# Patient Record
Sex: Male | Born: 1980 | Race: White | Hispanic: No | Marital: Married | Smoking: Never smoker
Health system: Southern US, Community
[De-identification: ages and names within clinical notes are randomized; demographics above are authoritative.]

---

## 2017-12-30 ENCOUNTER — Other Ambulatory Visit: Payer: Self-pay

## 2017-12-30 ENCOUNTER — Ambulatory Visit (INDEPENDENT_AMBULATORY_CARE_PROVIDER_SITE_OTHER): Payer: Worker's Compensation

## 2017-12-30 ENCOUNTER — Encounter: Payer: Self-pay | Admitting: Family Medicine

## 2017-12-30 ENCOUNTER — Ambulatory Visit (INDEPENDENT_AMBULATORY_CARE_PROVIDER_SITE_OTHER): Payer: Worker's Compensation | Admitting: Family Medicine

## 2017-12-30 VITALS — BP 132/90 | HR 83 | Temp 97.0°F | Ht 65.35 in | Wt 226.4 lb

## 2017-12-30 DIAGNOSIS — M79641 Pain in right hand: Secondary | ICD-10-CM

## 2017-12-30 DIAGNOSIS — M25531 Pain in right wrist: Secondary | ICD-10-CM | POA: Diagnosis not present

## 2017-12-30 DIAGNOSIS — M79601 Pain in right arm: Secondary | ICD-10-CM

## 2017-12-30 MED ORDER — IBUPROFEN 800 MG PO TABS: 800.0000 mg | ORAL_TABLET | Freq: Three times a day (TID) | ORAL | 0 refills | Status: DC | PRN

## 2017-12-30 MED ORDER — KETOROLAC TROMETHAMINE 60 MG/2ML IM SOLN
60.0000 mg | Freq: Once | INTRAMUSCULAR | Status: AC
Start: 2017-12-30 — End: 2017-12-30
  Administered 2017-12-30: 60 mg via INTRAMUSCULAR

## 2017-12-30 NOTE — Patient Instructions (Addendum)
Apply a compressive ACE bandage. Rest and elevate the affected painful area.  Apply cold compresses intermittently as needed.  As pain recedes, begin normal activities slowly as tolerated.    Ibuprofen  as needed for pain and swelling. Take with food.   Placed on light duty for work  Follow up in 1 week with another provider as I wont be in clinic.    IF you received an x-ray today, you will receive an invoice from [facility]. Please contact [facility] at [phone] with questions or concerns regarding your invoice.   IF you received labwork today, you will receive an invoice from [facility]. Please contact [facility] at [phone] with questions or concerns regarding your invoice.   Our billing staff will not be able to assist you with questions regarding bills from these companies.  You will be contacted with the lab results as soon as they are available. The fastest way to get your results is to activate your My Chart account. Instructions are located on the last page of this paperwork. If you have not heard from [place] regarding the results in 2 weeks, please contact this office.

## 2017-12-30 NOTE — Progress Notes (Signed)
5/28/201911:59 AM  Mark Liu 31-Oct-1980, 37 y.o. male [id number]  Chief Complaint  Patient presents with  . Hand Injury    RIGHT HAND INJURY INVOLVING GLASS TODAY    HPI:   Patient is a 37 y.o. male  who presents today for right forearm, wrist and hand pain and swelling  Today while at work, they were moving glass bathroom sliding doors They stated to fall over and they all landed onto his right arm They are about 100lbs each, glass did not brake He denies any numbness or tingling He is left handed  Fall Risk  12/30/2017  Falls in the past year? No     No flowsheet data found.  No Known Allergies  Prior to Admission medications   Medication Sig Start Date End Date Taking? Authorizing Provider  ARIPiprazole (ABILIFY) 10 MG tablet Take 10 mg by mouth daily. 11/28/17  Yes [provider]  buPROPion (WELLBUTRIN XL) 300 MG 24 hr tablet Take 300 mg by mouth daily. 11/28/17  Yes [provider]  clonazePAM (KLONOPIN) 1 MG tablet TAKE ONE-HALF TO ONE TABLET EVERY 8 HOURS AS NEEDED FOR ANXIETY 11/28/17  Yes [provider]  FLUoxetine (PROZAC) 20 MG capsule Take 20 mg by mouth daily. 12/12/17  Yes [provider]  lamoTRIgine (LAMICTAL) 150 MG tablet Take 150 mg by mouth daily. 11/28/17  Yes [provider]  modafinil (PROVIGIL) 200 MG tablet START IN ONE MONTH TAKE 1 TABLET BY MOUTH EVERY MORNING 11/28/17  Yes [provider]    History reviewed. No pertinent past medical history.  History reviewed. No pertinent surgical history.  Social History   Tobacco Use  . Smoking status: Never Smoker  . Smokeless tobacco: Never Used  Substance Use Topics  . Alcohol use: Not Currently    Family History  Problem Relation Age of Onset  . Healthy Mother   . Healthy Father     ROS Per hpi  OBJECTIVE:  Blood pressure 132/90, pulse 83, temperature (!) 97 F (36.1 C), temperature source Oral, height 5' 5.35" (1.66 m), weight  226 lb 6.4 oz (102.7 kg), SpO2 97 %.  Physical Exam  Gen: aaox3, nad RUE: swelling and TTP from 1/3 distal forearm to fingers. No ecchymosis, laceration or crepitus. There are some superficial abrasions over flexor side of forearm. No bleeding. Passive FROM. Distally NVI.  No results found for this or any previous visit (from the past 24 hour(s)).  Dg Forearm Right  Result Date: 12/30/2017 CLINICAL DATA:  Crush injury with possible retained foreign body. EXAM: RIGHT FOREARM - 2 VIEW COMPARISON:  None. FINDINGS: There is no evidence of fracture or other focal bone lesions. Soft tissues are unremarkable. IMPRESSION: No acute abnormality noted. Electronically Signed   By: Alcide Clever M.D.   On: 12/30/2017 12:20   Dg Hand Complete Right  Result Date: 12/30/2017 CLINICAL DATA:  Crush injury EXAM: RIGHT HAND - COMPLETE 3+ VIEW COMPARISON:  None. FINDINGS: There is no evidence of fracture or dislocation. There is no evidence of arthropathy or other focal bone abnormality. Soft tissues are unremarkable. IMPRESSION: No acute abnormality noted. Electronically Signed   By: Alcide Clever M.D.   On: 12/30/2017 12:23     ASSESSMENT and PLAN  1. Right arm pain - DG Forearm Right; Future - DG Hand Complete Right; Future - Apply ace wrap - ketorolac (TORADOL) injection 60 mg  2. Right wrist pain - DG Forearm Right; Future - DG Hand Complete Right; Future -  Apply ace wrap  3. Right hand pain - DG Forearm Right; Future - DG Hand Complete Right; Future - Apply ace wrap  Other orders No fractures on xray, discussed RICE therapy. Placed on light duty, Re-eval in 1 week.  - ibuprofen (ADVIL,MOTRIN) 800 MG tablet; Take 1 tablet (800 mg total) by mouth every 8 (eight) hours as needed.  Return in about 1 week (around 01/06/2018).    Myles Lipps, MD Primary Care at [facility] [address] [place], [place] [address] Ph.  [phone] Fax [phone]

## 2018-01-01 ENCOUNTER — Telehealth: Payer: Self-pay

## 2018-01-01 MED ORDER — HYDROCODONE-ACETAMINOPHEN 7.5-325 MG PO TABS: 1.0000 | ORAL_TABLET | Freq: Four times a day (QID) | ORAL | 0 refills | Status: DC | PRN

## 2018-01-01 NOTE — Telephone Encounter (Signed)
Spoke with pt.  Advised Dr. Adela Glimpse message.

## 2018-01-01 NOTE — Telephone Encounter (Signed)
Copied from CRM [id number]. Topic: General - Other >> Dec 31, 2017 12:46 PM Mcneil, Ja-Kwan wrote: Reason for CRM: Pt states the ibuprofen (ADVIL,MOTRIN) 800 MG tablet is not strong enough to relieve the pain. Pt requesting a call back to discuss option of getting a stronger medication. Cb# [phone]  Message to Dr. Leretha Pol for review

## 2018-01-01 NOTE — Telephone Encounter (Signed)
Please let patient know that I just sent a small prescription in for vicodin, which is an opiate. Please discuss with him that he is not mix with alcohol or other medications that make him sleepy. Also he is not to drive until he understands how it affects him. Thanks

## 2018-01-03 ENCOUNTER — Ambulatory Visit: Payer: Self-pay | Admitting: Family Medicine

## 2018-01-13 ENCOUNTER — Encounter: Payer: Self-pay | Admitting: Family Medicine

## 2018-01-13 ENCOUNTER — Other Ambulatory Visit: Payer: Self-pay

## 2018-01-13 ENCOUNTER — Ambulatory Visit (INDEPENDENT_AMBULATORY_CARE_PROVIDER_SITE_OTHER): Payer: Worker's Compensation | Admitting: Family Medicine

## 2018-01-13 VITALS — BP 112/70 | HR 98 | Temp 97.6°F | Resp 16 | Ht 74.0 in | Wt 231.0 lb

## 2018-01-13 DIAGNOSIS — M25531 Pain in right wrist: Secondary | ICD-10-CM

## 2018-01-13 DIAGNOSIS — M79644 Pain in right finger(s): Secondary | ICD-10-CM

## 2018-01-13 MED ORDER — IBUPROFEN 800 MG PO TABS: 800.0000 mg | ORAL_TABLET | Freq: Three times a day (TID) | ORAL | 0 refills | Status: AC | PRN

## 2018-01-13 MED ORDER — HYDROCODONE-ACETAMINOPHEN 5-325 MG PO TABS
1.0000 | ORAL_TABLET | Freq: Four times a day (QID) | ORAL | 0 refills | Status: AC | PRN
Start: 2018-01-13 — End: ?

## 2018-01-13 NOTE — Patient Instructions (Signed)
     IF you received an x-ray today, you will receive an invoice from [facility]. Please contact [facility] at [phone] with questions or concerns regarding your invoice.   IF you received labwork today, you will receive an invoice from [facility]. Please contact [facility] at [phone] with questions or concerns regarding your invoice.   Our billing staff will not be able to assist you with questions regarding bills from these companies.  You will be contacted with the lab results as soon as they are available. The fastest way to get your results is to activate your My Chart account. Instructions are located on the last page of this paperwork. If you have not heard from us regarding the results in 2 weeks, please contact this office.     

## 2018-01-13 NOTE — Progress Notes (Signed)
6/11/20191:38 PM  Mark Liu 1981-02-14, 37 y.o. male [id number]  Chief Complaint  Patient presents with  . right hand injury    1 week f/u.  Per pt hand is the same but hurts in different places now.  Taking norco for the pain    HPI:   Patient is a 37 y.o. male who presents today for followup on right arm injury that happened on 12/30/17  Injury fall of over 800 lbs of glass (bathroom sliding doors) onto hyperextended wrist He has been wearing compression ace warp, icing and elevating He states swelling and bruising improved Still having pain at base of thumb and along hyperthenar prominence, pain radiates up radial side of forearm States unable to pinch such as turning keys in car ignition, open bags, due to pain Denies any numbness or tingling He is left handed Has been on light duty at work Medications helping Normal xrays  Fall Risk  01/13/2018 12/30/2017  Falls in the past year? No No     Depression screen PHQ 2/9 01/13/2018  Decreased Interest 0  Down, Depressed, Hopeless 0  PHQ - 2 Score 0    Allergies  Allergen Reactions  . Lidocaine Nausea And Vomiting  . Sulfa Antibiotics Other (See Comments)    Prior to Admission medications   Medication Sig Start Date End Date Taking? Authorizing Provider  ARIPiprazole (ABILIFY) 10 MG tablet Take 10 mg by mouth daily. 11/28/17  Yes [provider]  buPROPion (WELLBUTRIN XL) 300 MG 24 hr tablet Take 300 mg by mouth daily. 11/28/17  Yes [provider]  clonazePAM (KLONOPIN) 1 MG tablet TAKE ONE-HALF TO ONE TABLET EVERY 8 HOURS AS NEEDED FOR ANXIETY 11/28/17  Yes [provider]  FLUoxetine (PROZAC) 20 MG capsule Take 20 mg by mouth daily. 12/12/17  Yes [provider]  HYDROcodone-acetaminophen (NORCO) 7.5-325 MG tablet Take 1 tablet by mouth every 6 (six) hours as needed for moderate pain. 01/01/18  Yes Myles Lipps, MD  ibuprofen (ADVIL,MOTRIN) 800 MG tablet Take 1 tablet (800 mg  total) by mouth every 8 (eight) hours as needed. 12/30/17  Yes Myles Lipps, MD  lamoTRIgine (LAMICTAL) 150 MG tablet Take 150 mg by mouth daily. 11/28/17  Yes [provider]  modafinil (PROVIGIL) 200 MG tablet START IN ONE MONTH TAKE 1 TABLET BY MOUTH EVERY MORNING 11/28/17  Yes [provider]    History reviewed. No pertinent past medical history.  History reviewed. No pertinent surgical history.  Social History   Tobacco Use  . Smoking status: Never Smoker  . Smokeless tobacco: Never Used  Substance Use Topics  . Alcohol use: Not Currently    Family History  Problem Relation Age of Onset  . Healthy Mother   . Healthy Father     ROS Per hpi  OBJECTIVE:  Blood pressure 112/70, pulse 98, temperature 97.6 F (36.4 C), temperature source Oral, resp. rate 16, height 6\' 2"  (1.88 m), weight 231 lb (104.8 kg), SpO2 96 %.  Physical Exam  Gen: AAOx3, NAD MSK: right hand no swelling or ecchymosis noted, FROM of hand and wrist, pain with supination and extension of wrist. Pain with abduction of thumb. TTP at base of right thumb and hyperthenar eminence. + Finkelstein sign. NVI intact.   ASSESSMENT and PLAN  1. Pain of right thumb 2. Right wrist pain - Ambulatory referral to Hand Surgery  Cont with conservative measures. Cont light duty at work. Referring to hand for eval of sprain  vs more concerning ligamentous injury. Meds refilled.  Other orders - ibuprofen (ADVIL,MOTRIN) 800 MG tablet; Take 1 tablet (800 mg total) by mouth every 8 (eight) hours as needed. - HYDROcodone-acetaminophen (NORCO/VICODIN) 5-325 MG tablet; Take 1 tablet by mouth every 6 (six) hours as needed for moderate pain.  No follow-ups on file.    Myles LippsIrma M Santiago, MD [facility] at [facility] [address] [place], [place] [address] Ph.  [phone] Fax [phone]

## 2018-02-23 ENCOUNTER — Telehealth: Payer: Self-pay | Admitting: Family Medicine

## 2018-02-23 NOTE — Telephone Encounter (Signed)
Copied from CRM [phone]. Topic: Quick Communication - See Telephone Encounter >> Feb 20, 2018  9:34 AM Raquel SarnaHayes, Teresa G wrote: Asher MuirJamie [phone] [employer] - Handling Pt's Worker's Comp.  Claim # [id number]  Requested by fax and mail for pt's medical records and any bills - June 19th

## 2018-02-25 NOTE — Telephone Encounter (Signed)
Records fax on 02/25/18

## 2020-04-01 IMAGING — DX DG HAND COMPLETE 3+V*R*
3 series · 3 of 3 positions shown · non-contrast
Comparison: None.

CLINICAL DATA: Crush injury

EXAM:
RIGHT HAND - COMPLETE 3+ VIEW

[hand pa]
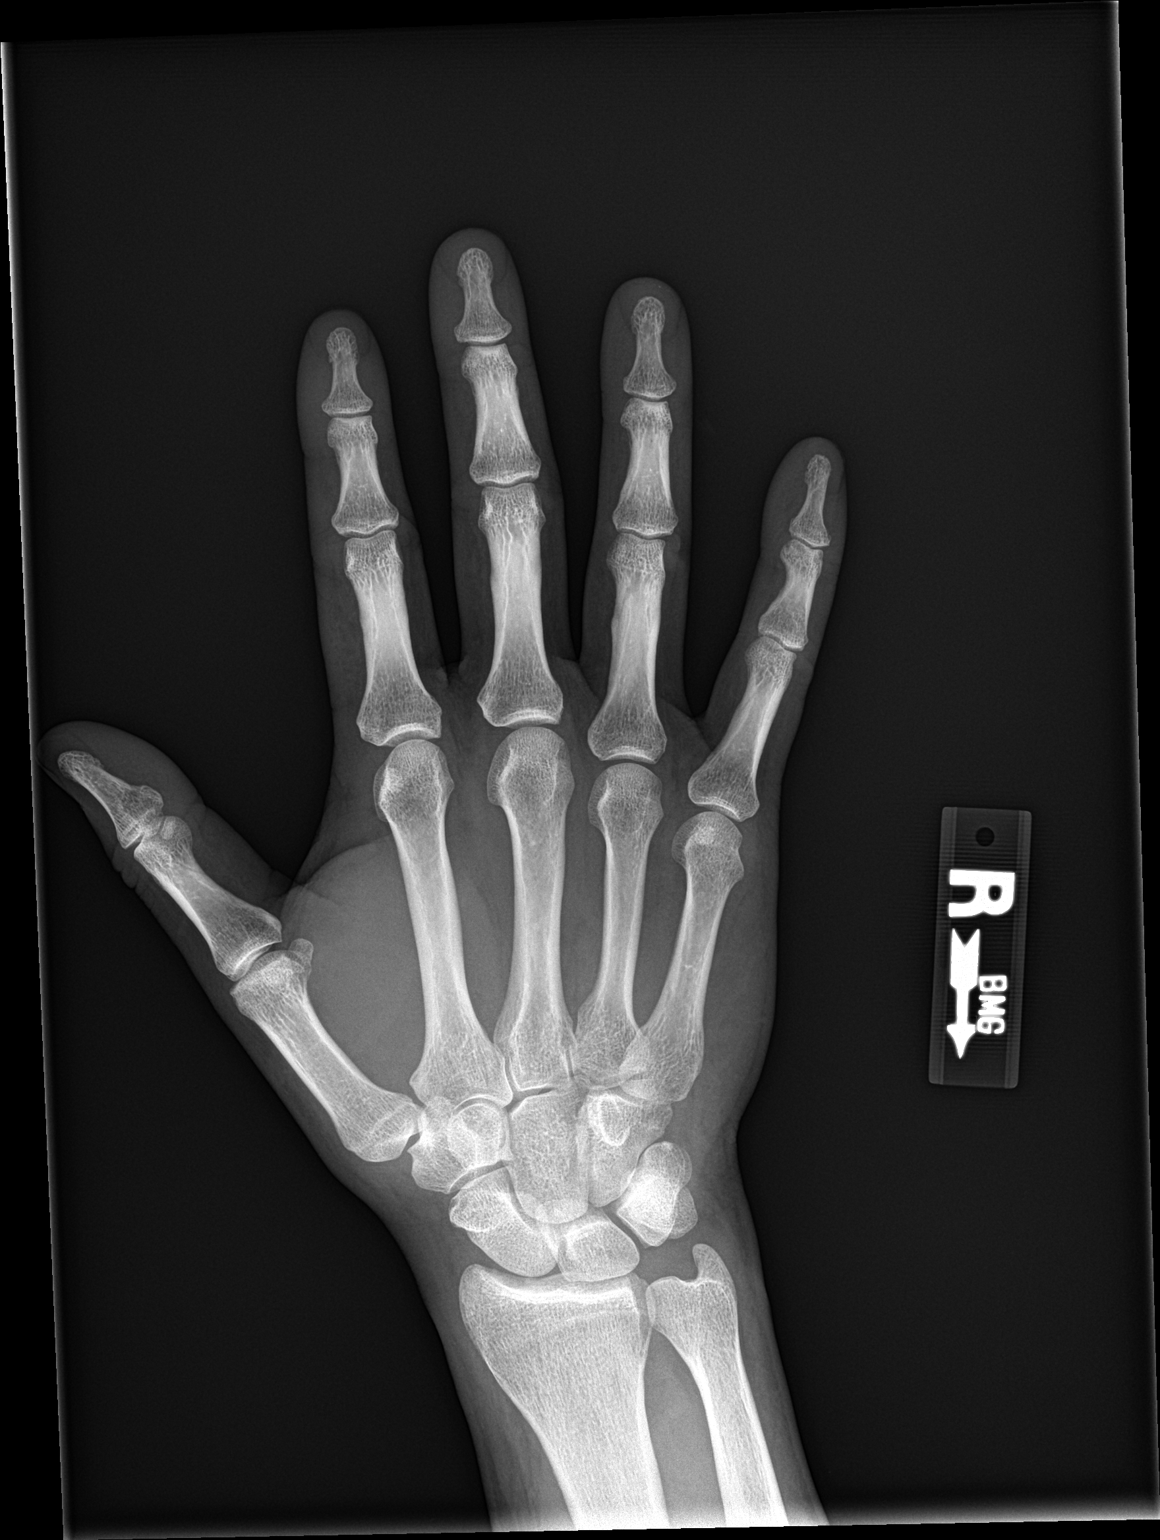

[hand obl]
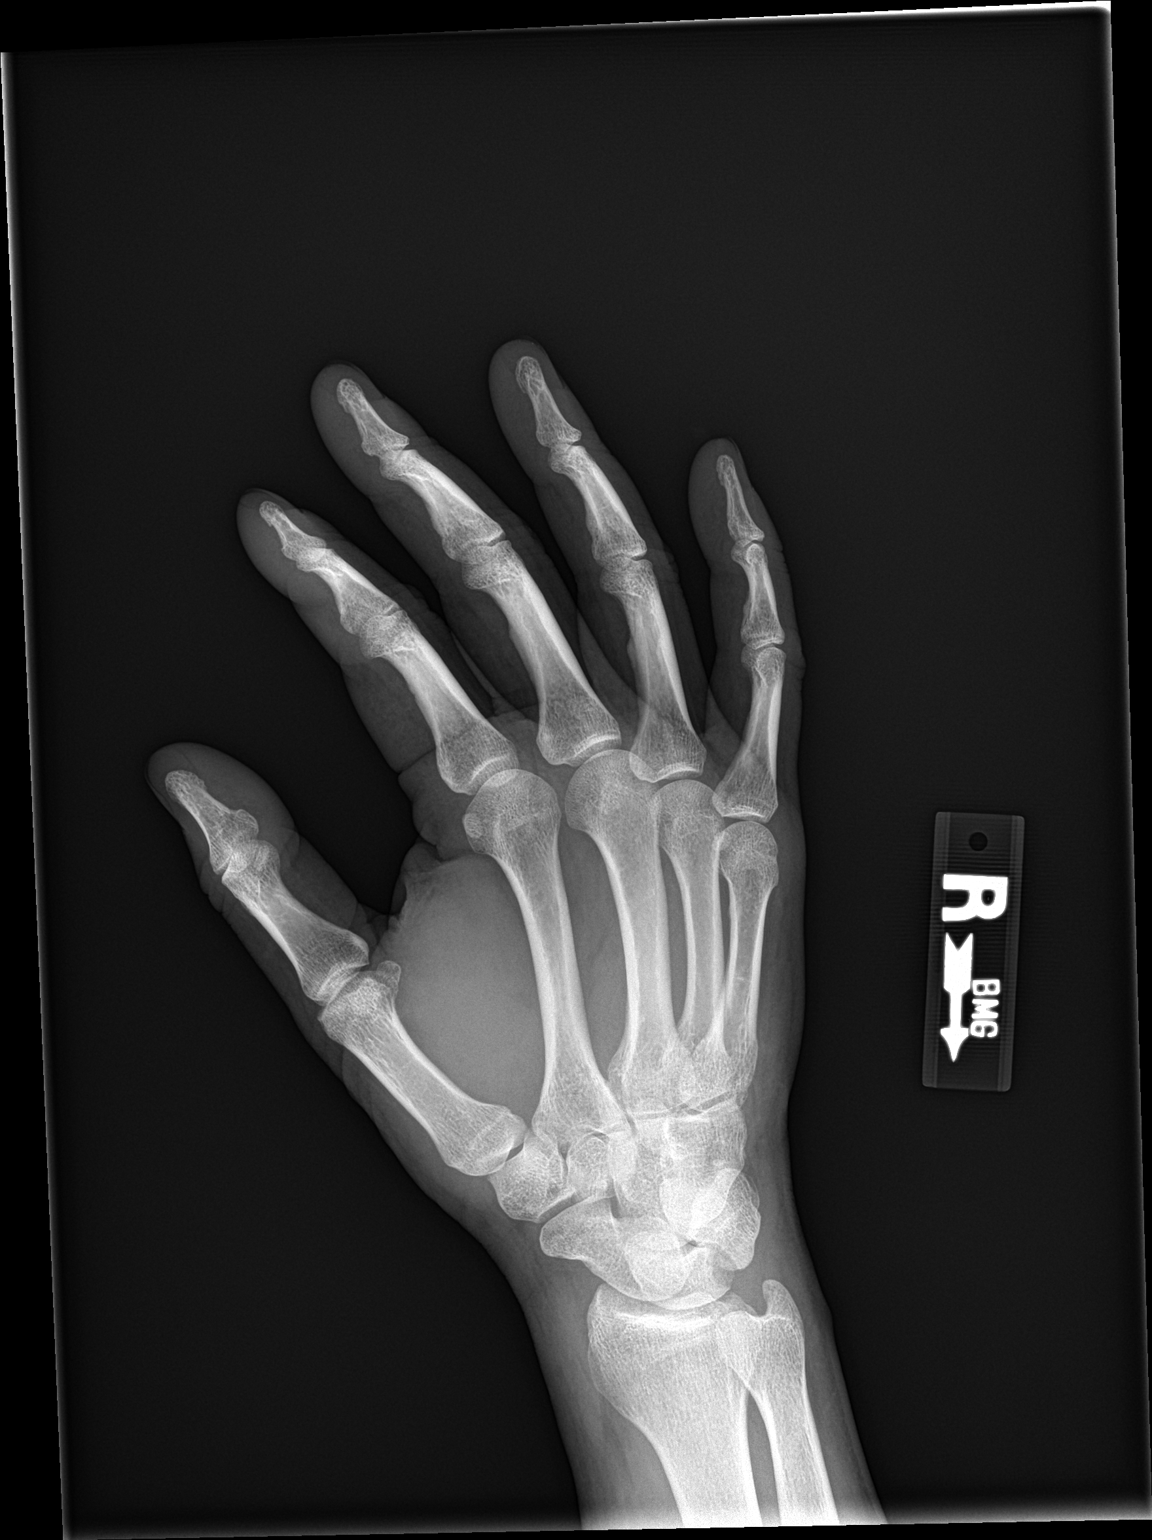

[hand lat]
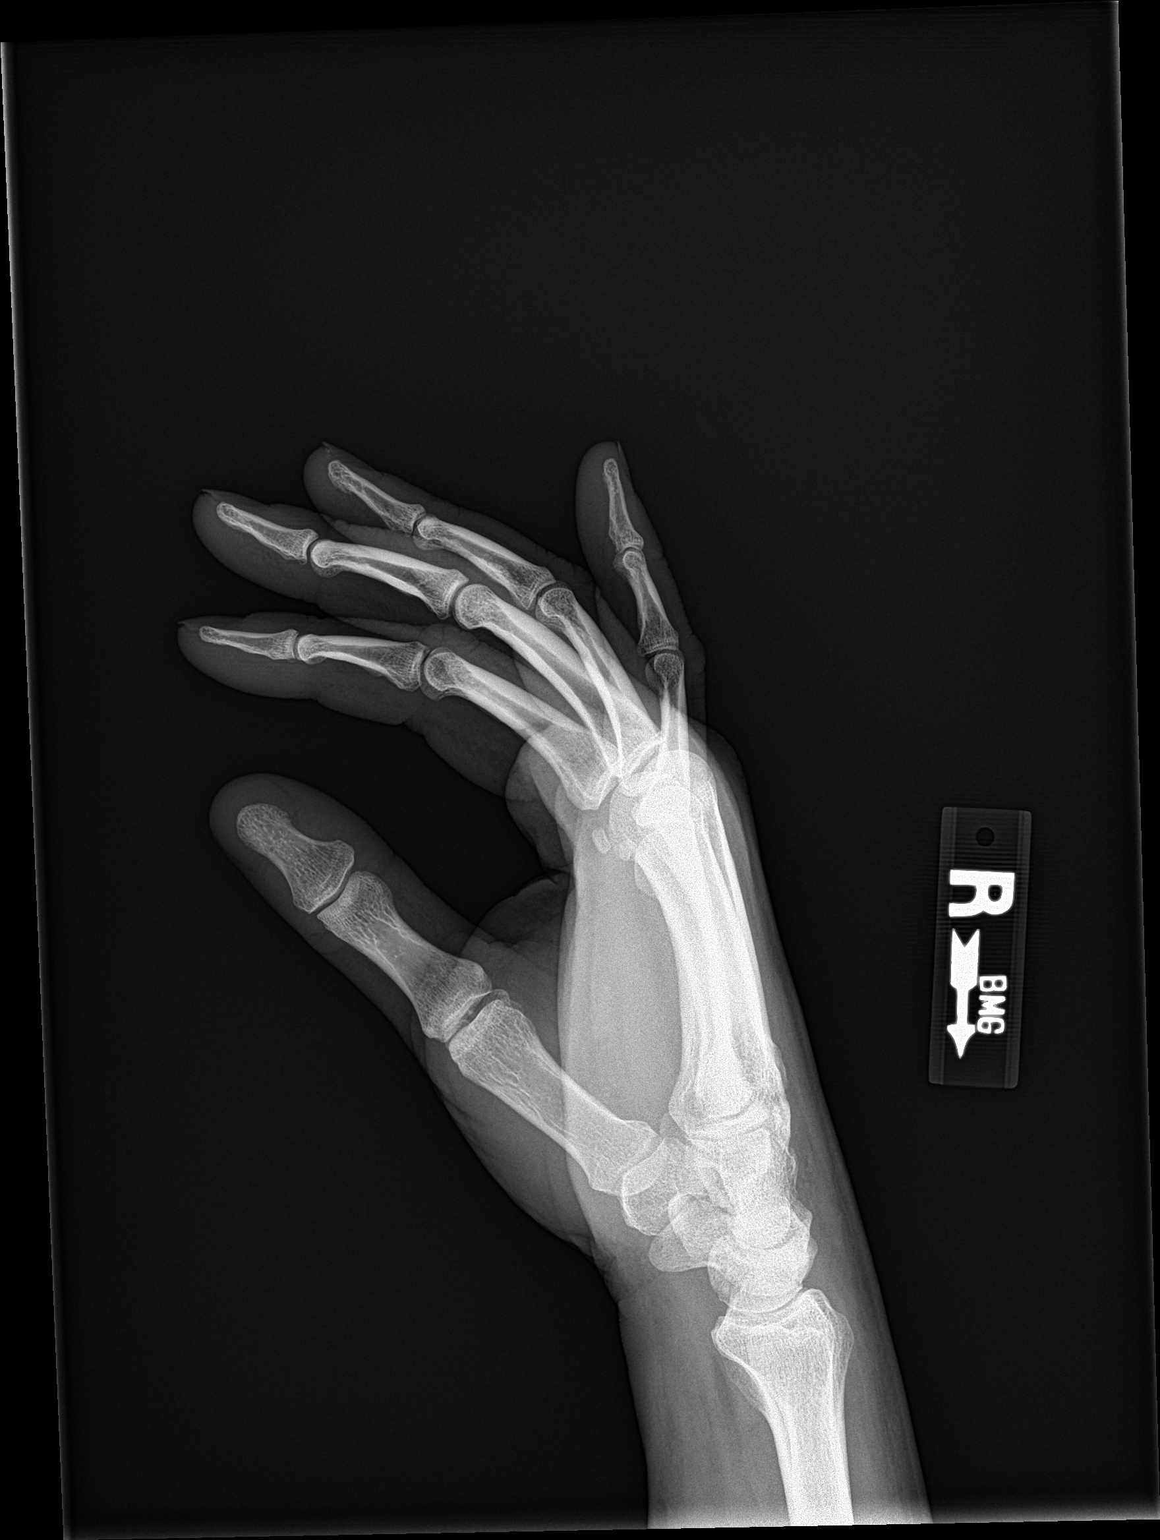

[3 of 3 positions shown; findings below may reference images not displayed]

FINDINGS: There is no evidence of fracture or dislocation. There is no
evidence of arthropathy or other focal bone abnormality. Soft
tissues are unremarkable.
IMPRESSION: No acute abnormality noted.
